# Patient Record
Sex: Female | Born: 1965 | Hispanic: Yes | Marital: Married | Smoking: Never smoker
Health system: Southern US, Community
[De-identification: ages and names within clinical notes are randomized; demographics above are authoritative.]

## PROBLEM LIST (undated history)

## (undated) DIAGNOSIS — I10 Essential (primary) hypertension: Secondary | ICD-10-CM

## (undated) HISTORY — PX: BREAST ENHANCEMENT SURGERY: SHX7

## (undated) HISTORY — PX: ABDOMINAL HYSTERECTOMY: SHX81

---

## 2009-11-06 HISTORY — PX: AUGMENTATION MAMMAPLASTY: SUR837

## 2018-10-14 ENCOUNTER — Emergency Department: Payer: Self-pay

## 2018-10-14 ENCOUNTER — Other Ambulatory Visit: Payer: Self-pay

## 2018-10-14 DIAGNOSIS — R0789 Other chest pain: Secondary | ICD-10-CM | POA: Insufficient documentation

## 2018-10-14 DIAGNOSIS — I10 Essential (primary) hypertension: Secondary | ICD-10-CM | POA: Insufficient documentation

## 2018-10-14 LAB — CBC
HCT: 38.6 % (ref 36.0–46.0)
Hemoglobin: 12.9 g/dL (ref 12.0–15.0)
MCH: 30.3 pg (ref 26.0–34.0)
MCHC: 33.4 g/dL (ref 30.0–36.0)
MCV: 90.6 fL (ref 80.0–100.0)
Platelets: 297 10*3/uL (ref 150–400)
RBC: 4.26 MIL/uL (ref 3.87–5.11)
RDW: 12.9 % (ref 11.5–15.5)
WBC: 6.7 10*3/uL (ref 4.0–10.5)
nRBC: 0 % (ref 0.0–0.2)

## 2018-10-14 LAB — BASIC METABOLIC PANEL
ANION GAP: 7 (ref 5–15)
BUN: 14 mg/dL (ref 6–20)
CO2: 24 mmol/L (ref 22–32)
Calcium: 9.1 mg/dL (ref 8.9–10.3)
Chloride: 107 mmol/L (ref 98–111)
Creatinine, Ser: 0.87 mg/dL (ref 0.44–1.00)
GFR calc Af Amer: >60 mL/min (ref >60)
GFR calc non Af Amer: >60 mL/min (ref >60)
Glucose, Bld: 100 mg/dL — ABNORMAL HIGH (ref 70–99)
POTASSIUM: 3.6 mmol/L (ref 3.5–5.1)
Sodium: 138 mmol/L (ref 135–145)

## 2018-10-14 LAB — TROPONIN I

## 2018-10-14 LAB — POCT PREGNANCY, URINE: Preg Test, Ur: NEGATIVE

## 2018-10-14 NOTE — ED Triage Notes (Signed)
Pt arrives to ED via POV from home with c/o left-sided CP with radiation into the left arm x2 hrs. (+) nausea, but denies vomiting. Pt denies any c/o SHOB. Pt with h/x of HTN but no other cardiac h/x.

## 2018-10-15 ENCOUNTER — Emergency Department
Admission: EM | Admit: 2018-10-15 | Discharge: 2018-10-15 | Disposition: A | Discharge status: Home / Self Care | Payer: Self-pay | Attending: Emergency Medicine | Admitting: Emergency Medicine

## 2018-10-15 DIAGNOSIS — R0789 Other chest pain: Secondary | ICD-10-CM

## 2018-10-15 HISTORY — DX: Essential (primary) hypertension: I10

## 2018-10-15 NOTE — ED Provider Notes (Signed)
[facility] Emergency Department Provider Note   ____________________________________________    I have reviewed the triage vital signs and the nursing notes.   HISTORY  Chief Complaint Chest Pain  Son is translating, no interpreter currently   HPI Tracey Raymond is a 52 y.o. female who presents with complaints of anterior chest discomfort.  Patient describes pain along the left side of her sternum.  It is worse with movement of her left arm.  She denies fevers or chills or diaphoresis.  No shortness of breath.  No cough or nausea or vomiting.  No history of heart disease.  She reports this pain started today and seems to have improved while in the waiting room.  No abdominal pain.  Has not taken anything for this.  Pain does not radiate   Past Medical History:  Diagnosis Date  . Hypertension     There are no active problems to display for this patient.   Past Surgical History:  Procedure Laterality Date  . ABDOMINAL HYSTERECTOMY    . BREAST ENHANCEMENT SURGERY    . CESAREAN SECTION      Prior to Admission medications   Not on File     Allergies Iodine and Metoclopramide  No family history on file.  Social History Social History   Tobacco Use  . Smoking status: Never Smoker  . Smokeless tobacco: Never Used  Substance Use Topics  . Alcohol use: Not on file  . Drug use: Not on file    Review of Systems  Constitutional: No fever/chills Eyes: No visual changes.  ENT: No neck pain Cardiovascular: As above Respiratory: Denies shortness of breath. Gastrointestinal: No abdominal pain.  No nausea, no vomiting.   Genitourinary: Negative for dysuria. Musculoskeletal: No calf pain Skin: Negative for rash. Neurological: Negative for headaches    ____________________________________________   PHYSICAL EXAM:  VITAL SIGNS: ED Triage Vitals  Enc Vitals Group     BP 10/14/18 2019 (!) 145/85     Pulse Rate 10/14/18 2019 75    Resp 10/14/18 2019 16     Temp 10/14/18 2019 98.4 F (36.9 C)     Temp Source 10/14/18 2019 Oral     SpO2 10/14/18 2019 100 %     Weight 10/14/18 2014 65.8 kg (145 lb)     Height 10/14/18 2014 1.499 m (4\' 11" )     Head Circumference --      Peak Flow --      Pain Score 10/14/18 2014 6     Pain Loc --      Pain Edu? --      Excl. in GC? --     Constitutional: Alert and oriented. No acute distress.  Eyes: Conjunctivae are normal.   Nose: No congestion/rhinnorhea. Mouth/Throat: Mucous membranes are moist.    Cardiovascular: Normal rate, regular rhythm. Grossly normal heart sounds.  Good peripheral circulation.  Respiratory: Normal respiratory effort.  No retractions. Lungs CTAB. Gastrointestinal: Soft and nontender. No distention.  No CVA tenderness. Genitourinary: deferred Musculoskeletal: No lower extremity tenderness nor edema.  Warm and well perfused Neurologic:  Normal speech and language. No gross focal neurologic deficits are appreciated.  Skin:  Skin is warm, dry and intact. No rash noted. Psychiatric: Mood and affect are normal. Speech and behavior are normal.  ____________________________________________   LABS (all labs ordered are listed, but only abnormal results are displayed)  Labs Reviewed  BASIC METABOLIC PANEL - Abnormal; Notable for the following components:  Result Value   Glucose, Bld 100 (*)    All other components within normal limits  CBC  TROPONIN I  POC URINE PREG, ED  POCT PREGNANCY, URINE    EKG  Rhythm: normal sinus rhythm QRS Axis: normal Intervals: normal ST/T Wave abnormalities: normal Narrative Interpretation: no evidence of acute ischemia  ____________________________________________  RADIOLOGY  Chest x-ray normal ____________________________________________   PROCEDURES  Procedure(s) performed: No  Procedures   Critical Care performed: No ____________________________________________   INITIAL IMPRESSION /  ASSESSMENT AND PLAN / ED COURSE  Pertinent labs & imaging results that were available during my care of the patient were reviewed by me and considered in my medical decision making (see chart for details).  Patient well-appearing in no acute distress.  She does have tenderness along the left border of her sternum which seems to mimic her pain.  EKG is entirely normal, lab work is greatly reassuring.  Chest x-ray normal.  Certainly seems to be consistent with chest wall pain.  Recommend anti-inflammatory/NSAIDs, rest and PCP follow-up.  Return precautions discussed if any worsening pain or shortness of breath    ____________________________________________   FINAL CLINICAL IMPRESSION(S) / ED DIAGNOSES  Final diagnoses:  Chest wall pain        Note:  This document was prepared using Dragon voice recognition software and may include unintentional dictation errors.    Jene Every, MD 10/15/18 [phone]

## 2018-10-15 NOTE — ED Notes (Signed)
ED Provider at bedside. 

## 2020-01-26 ENCOUNTER — Other Ambulatory Visit: Payer: Self-pay | Admitting: Physician Assistant

## 2020-01-26 DIAGNOSIS — R590 Localized enlarged lymph nodes: Secondary | ICD-10-CM

## 2020-02-09 ENCOUNTER — Ambulatory Visit: Payer: Self-pay | Attending: Internal Medicine

## 2020-02-09 DIAGNOSIS — Z23 Encounter for immunization: Secondary | ICD-10-CM

## 2020-02-09 NOTE — Progress Notes (Signed)
   Covid-19 Vaccination Clinic  Name:  Mackena Plummer    MRN: [id number] DOB: 1966/09/21  02/09/2020  Ms. Tomeika Weinmann was observed post Covid-19 immunization for 15 minutes without incident. She was provided with Vaccine Information Sheet and instruction to access the V-Safe system.   Ms. Judyth Demarais was instructed to call 911 with any severe reactions post vaccine: Marland Kitchen Difficulty breathing  . Swelling of face and throat  . A fast heartbeat  . A bad rash all over body  . Dizziness and weakness   Immunizations Administered    Name Date Dose VIS Date Route   Pfizer COVID-19 Vaccine 02/09/2020  3:39 PM 0.3 mL 10/17/2019 Intramuscular   Manufacturer: [employer], [employer]   Lot: [id number]   NDC: [address]-0

## 2020-02-16 ENCOUNTER — Ambulatory Visit
Admission: RE | Admit: 2020-02-16 | Discharge: 2020-02-16 | Disposition: A | Discharge status: Home / Self Care | Payer: 59 | Source: Ambulatory Visit | Attending: Physician Assistant | Admitting: Physician Assistant

## 2020-02-16 ENCOUNTER — Other Ambulatory Visit: Payer: Self-pay

## 2020-02-16 DIAGNOSIS — R590 Localized enlarged lymph nodes: Secondary | ICD-10-CM | POA: Insufficient documentation

## 2020-03-01 ENCOUNTER — Ambulatory Visit: Payer: 59 | Attending: Internal Medicine

## 2020-03-01 DIAGNOSIS — Z23 Encounter for immunization: Secondary | ICD-10-CM

## 2020-03-01 NOTE — Progress Notes (Signed)
   Covid-19 Vaccination Clinic  Name:  Tracey Raymond    MRN: [id number] DOB: 04/21/1966  03/01/2020  Ms. Tracey Raymond was observed post Covid-19 immunization for 15 minutes without incident. She was provided with Vaccine Information Sheet and instruction to access the V-Safe system.   Ms. Tracey Raymond was instructed to call 911 with any severe reactions post vaccine: Marland Kitchen Difficulty breathing  . Swelling of face and throat  . A fast heartbeat  . A bad rash all over body  . Dizziness and weakness   Immunizations Administered    Name Date Dose VIS Date Route   Pfizer COVID-19 Vaccine 03/01/2020  3:33 PM 0.3 mL 12/31/2018 Intramuscular   Manufacturer: [employer], [employer]   Lot: [id number]   NDC: [address]-8

## 2020-04-02 ENCOUNTER — Other Ambulatory Visit: Payer: Self-pay | Admitting: Physician Assistant

## 2020-04-02 DIAGNOSIS — Q899 Congenital malformation, unspecified: Secondary | ICD-10-CM

## 2020-05-17 ENCOUNTER — Other Ambulatory Visit: Payer: Self-pay | Admitting: Physician Assistant

## 2020-05-17 DIAGNOSIS — Z1231 Encounter for screening mammogram for malignant neoplasm of breast: Secondary | ICD-10-CM

## 2020-05-24 ENCOUNTER — Other Ambulatory Visit: Payer: Self-pay

## 2020-05-24 ENCOUNTER — Ambulatory Visit
Admission: RE | Admit: 2020-05-24 | Discharge: 2020-05-24 | Disposition: A | Discharge status: Home / Self Care | Payer: 59 | Source: Ambulatory Visit | Attending: Physician Assistant | Admitting: Physician Assistant

## 2020-05-24 DIAGNOSIS — Q899 Congenital malformation, unspecified: Secondary | ICD-10-CM

## 2020-05-31 ENCOUNTER — Other Ambulatory Visit: Payer: Self-pay

## 2020-05-31 ENCOUNTER — Ambulatory Visit
Admission: RE | Admit: 2020-05-31 | Discharge: 2020-05-31 | Disposition: A | Discharge status: Home / Self Care | Payer: 59 | Source: Ambulatory Visit | Attending: Physician Assistant | Admitting: Physician Assistant

## 2020-05-31 DIAGNOSIS — Q899 Congenital malformation, unspecified: Secondary | ICD-10-CM | POA: Insufficient documentation

## 2020-06-14 ENCOUNTER — Ambulatory Visit
Admission: RE | Admit: 2020-06-14 | Discharge: 2020-06-14 | Disposition: A | Discharge status: Home / Self Care | Payer: 59 | Source: Ambulatory Visit | Attending: Physician Assistant | Admitting: Physician Assistant

## 2020-06-14 ENCOUNTER — Other Ambulatory Visit: Payer: Self-pay

## 2020-06-14 ENCOUNTER — Other Ambulatory Visit: Payer: Self-pay | Admitting: Physician Assistant

## 2020-06-14 DIAGNOSIS — Z1231 Encounter for screening mammogram for malignant neoplasm of breast: Secondary | ICD-10-CM

## 2020-06-15 ENCOUNTER — Other Ambulatory Visit: Payer: Self-pay | Admitting: Physician Assistant

## 2020-06-15 DIAGNOSIS — R928 Other abnormal and inconclusive findings on diagnostic imaging of breast: Secondary | ICD-10-CM

## 2020-06-15 DIAGNOSIS — N6489 Other specified disorders of breast: Secondary | ICD-10-CM

## 2020-07-05 ENCOUNTER — Other Ambulatory Visit: Payer: Self-pay

## 2020-07-05 ENCOUNTER — Ambulatory Visit
Admission: RE | Admit: 2020-07-05 | Discharge: 2020-07-05 | Disposition: A | Discharge status: Home / Self Care | Payer: 59 | Source: Ambulatory Visit | Attending: Physician Assistant | Admitting: Physician Assistant

## 2020-07-05 DIAGNOSIS — R928 Other abnormal and inconclusive findings on diagnostic imaging of breast: Secondary | ICD-10-CM | POA: Insufficient documentation

## 2020-07-05 DIAGNOSIS — N6489 Other specified disorders of breast: Secondary | ICD-10-CM | POA: Diagnosis present

## 2021-02-01 ENCOUNTER — Other Ambulatory Visit: Payer: Self-pay | Admitting: Family Medicine

## 2021-02-01 DIAGNOSIS — M25561 Pain in right knee: Secondary | ICD-10-CM

## 2021-02-28 ENCOUNTER — Other Ambulatory Visit: Payer: Self-pay | Admitting: Physician Assistant

## 2021-02-28 DIAGNOSIS — G8929 Other chronic pain: Secondary | ICD-10-CM

## 2021-02-28 DIAGNOSIS — M25559 Pain in unspecified hip: Secondary | ICD-10-CM

## 2021-02-28 DIAGNOSIS — M25561 Pain in right knee: Secondary | ICD-10-CM

## 2021-03-15 ENCOUNTER — Ambulatory Visit
Admission: RE | Admit: 2021-03-15 | Discharge: 2021-03-15 | Disposition: A | Discharge status: Home / Self Care | Payer: 59 | Attending: Family Medicine | Admitting: Family Medicine

## 2021-03-15 ENCOUNTER — Other Ambulatory Visit: Payer: Self-pay | Admitting: Physician Assistant

## 2021-03-15 ENCOUNTER — Ambulatory Visit
Admission: RE | Admit: 2021-03-15 | Discharge: 2021-03-15 | Disposition: A | Discharge status: Home / Self Care | Payer: 59 | Source: Ambulatory Visit | Attending: Physician Assistant | Admitting: Physician Assistant

## 2021-03-15 DIAGNOSIS — M25559 Pain in unspecified hip: Secondary | ICD-10-CM

## 2021-03-15 DIAGNOSIS — G8929 Other chronic pain: Secondary | ICD-10-CM | POA: Insufficient documentation

## 2021-03-15 DIAGNOSIS — M25561 Pain in right knee: Secondary | ICD-10-CM

## 2021-04-29 IMAGING — MG DIGITAL SCREENING BREAST BILAT IMPLANT W/ TOMO W/ CAD
8 of 12 series · 8 of 28 positions shown · non-contrast
Comparison: None.

CLINICAL DATA: Screening.

EXAM:
DIGITAL SCREENING BILATERAL MAMMOGRAM WITH IMPLANTS, CAD AND TOMO
The patient has retropectoral silicone implants. Standard and
implant displaced views were performed.

[R MLO]
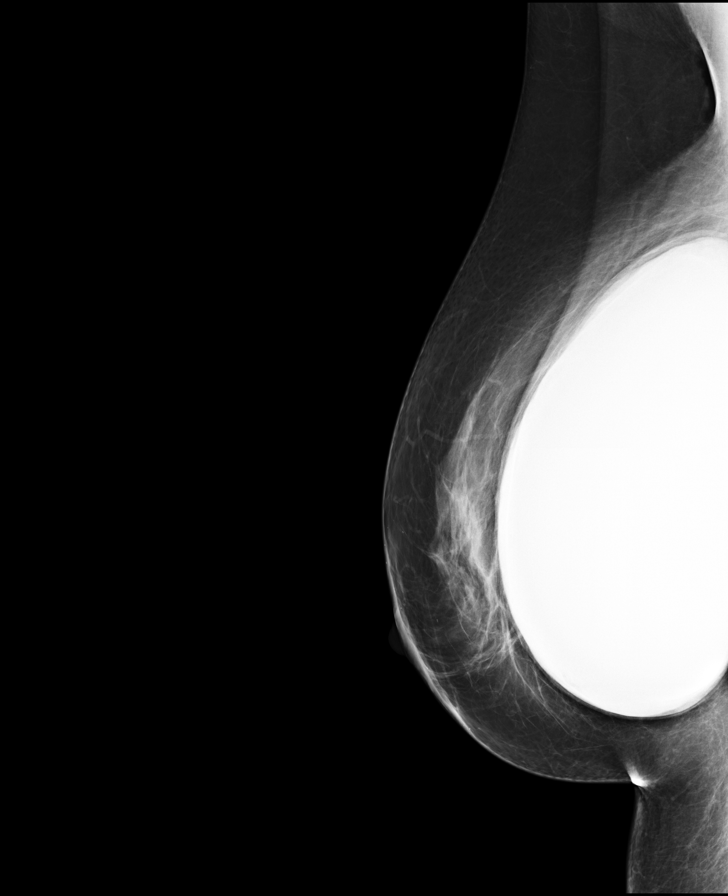

[L MLO]
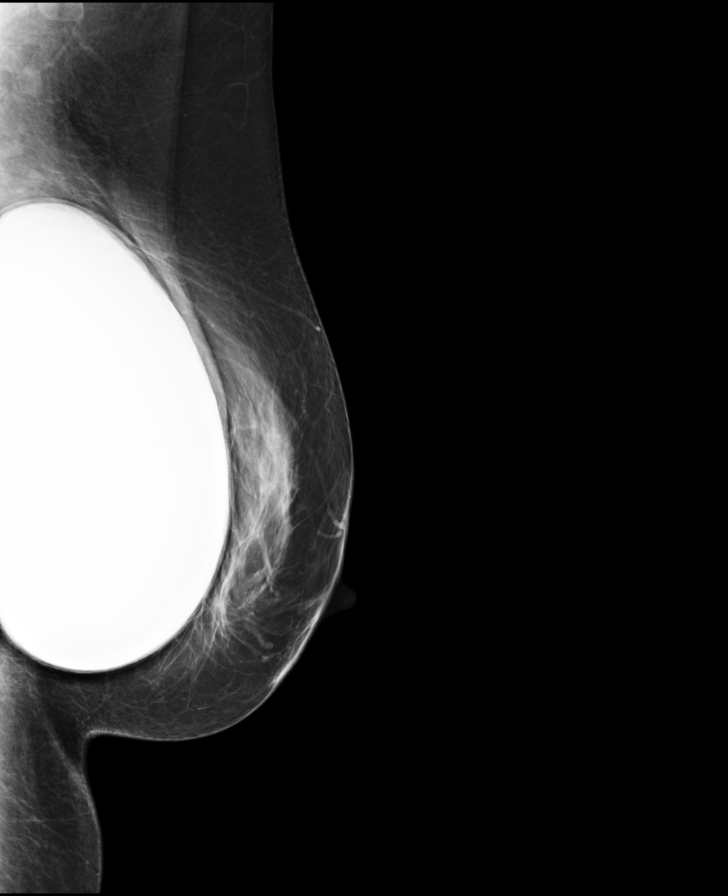

[L CC]
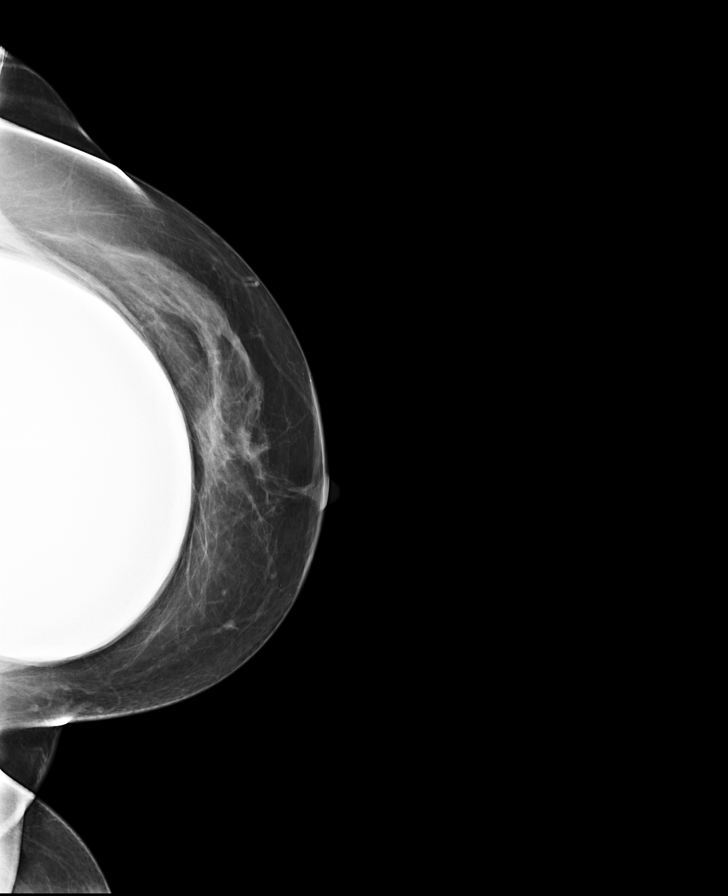

[R CC]
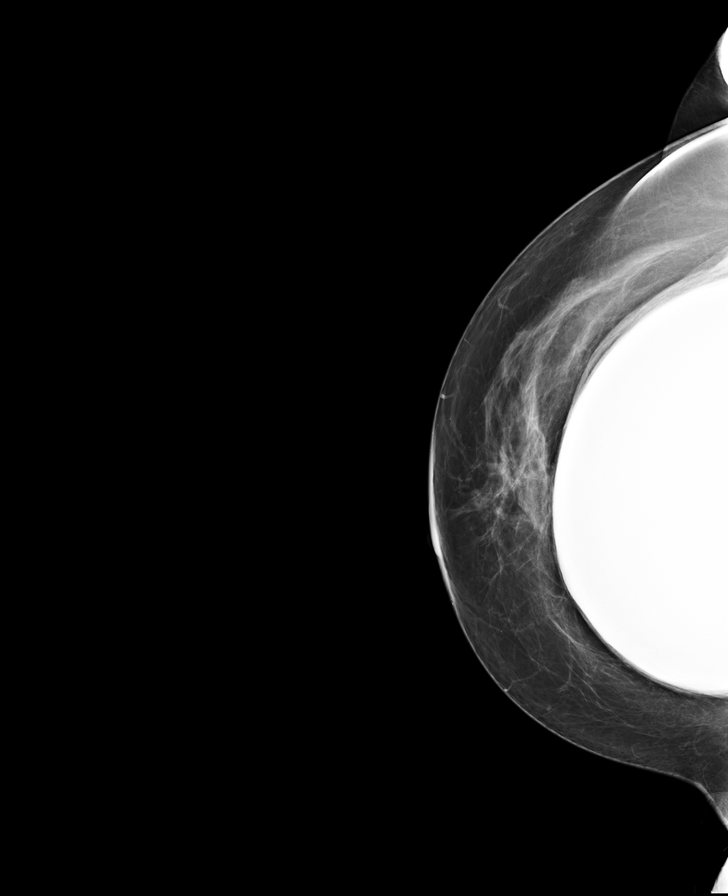

[R CC synth-2D]
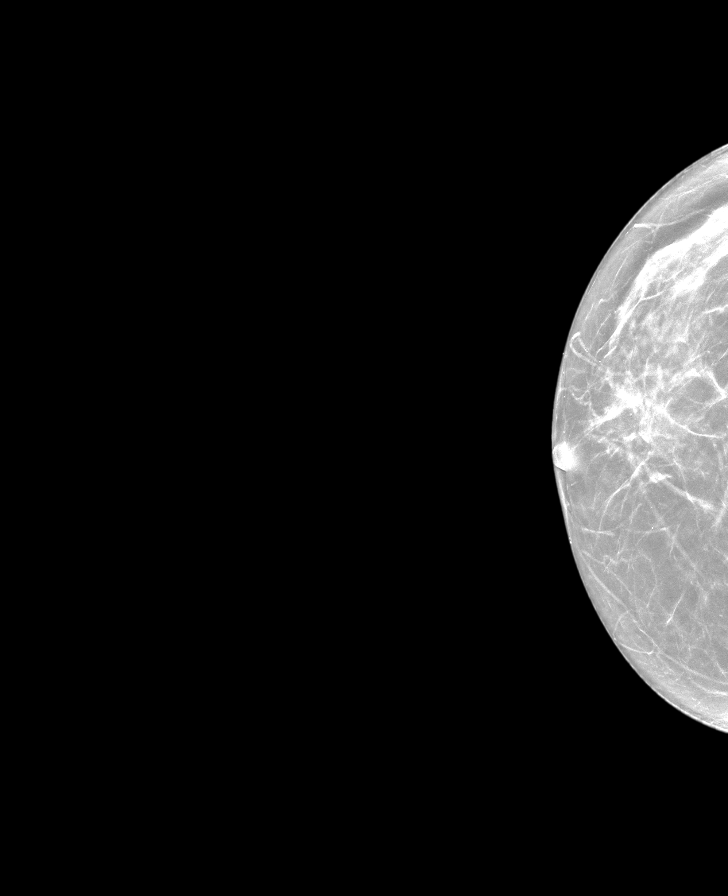

[R MLO synth-2D]
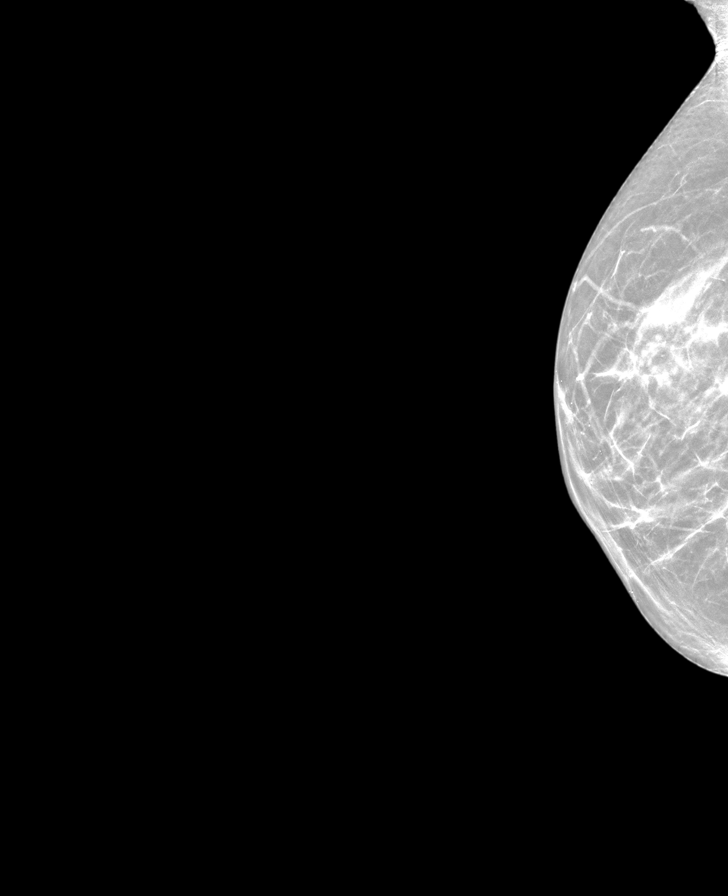

[L MLO synth-2D]
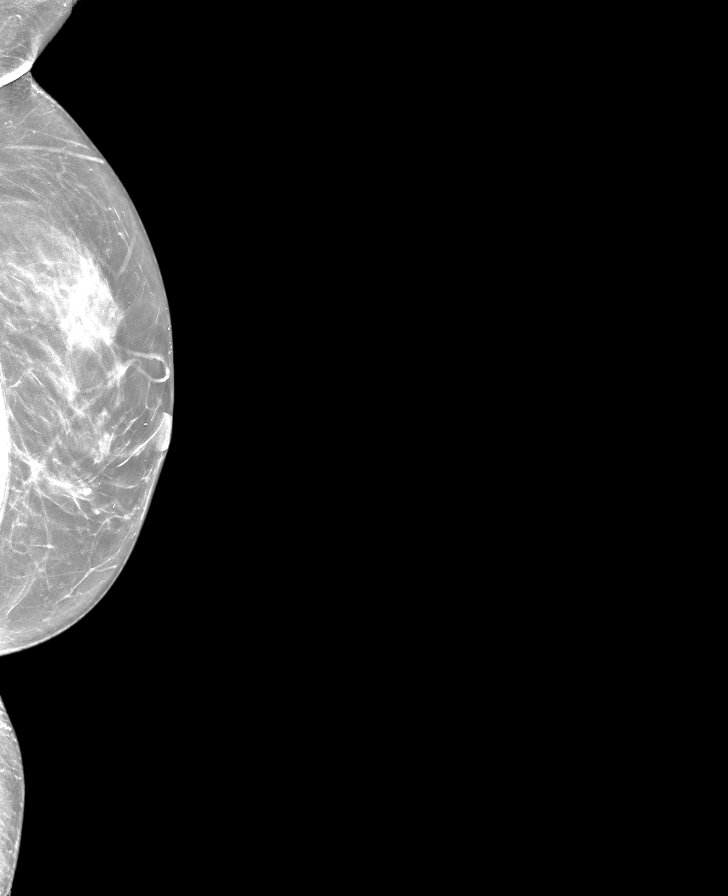

[L CC synth-2D]
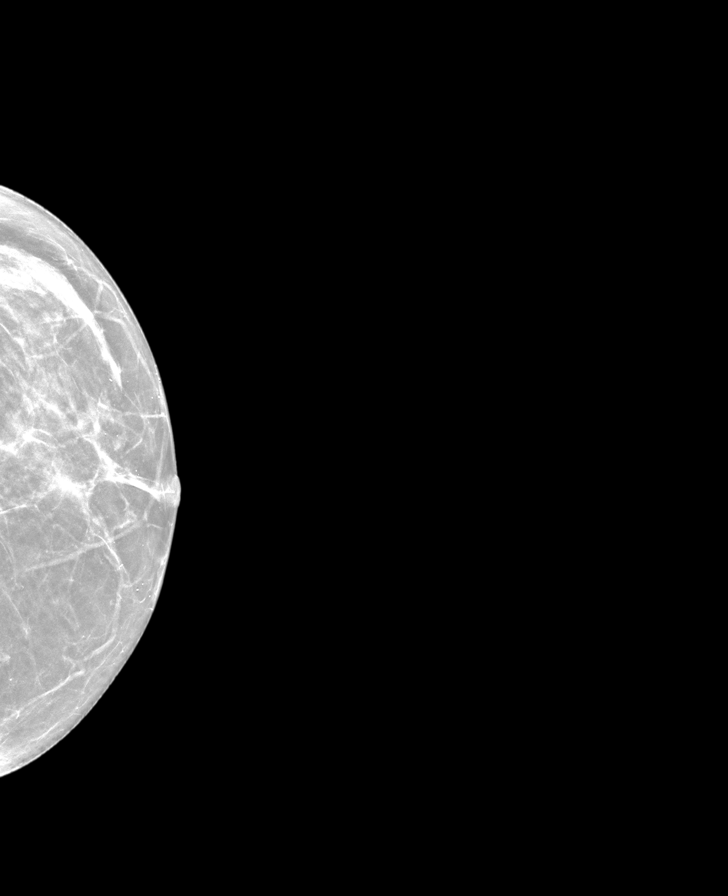

[8 of 28 positions shown; findings below may reference images not displayed]

ACR Breast Density Category b: There are scattered areas of
fibroglandular density.
FINDINGS: In the right breast, a possible focal asymmetry warrants further
evaluation. It is best seen on displaced MLO slice 31 in the
anterior depth upper breast. There is a possible correlate on CC
slice 31 in the outer breast. In the left breast, no suspicious
masses or malignant type calcifications are identified. Images were
processed with CAD.
IMPRESSION: Further evaluation is suggested for possible focal asymmetry in the
right breast.

RECOMMENDATION:
Diagnostic mammogram and possibly ultrasound of the right breast.
(Code:[id number])

The patient will be contacted regarding the findings, and additional
imaging will be scheduled.

BI-RADS CATEGORY  0: Incomplete. Need additional imaging evaluation
and/or prior mammograms for comparison.

## 2022-05-29 ENCOUNTER — Other Ambulatory Visit: Payer: Self-pay | Admitting: Physician Assistant

## 2022-05-29 DIAGNOSIS — R928 Other abnormal and inconclusive findings on diagnostic imaging of breast: Secondary | ICD-10-CM

## 2022-05-30 ENCOUNTER — Other Ambulatory Visit: Payer: Self-pay | Admitting: Physician Assistant

## 2022-05-30 DIAGNOSIS — R928 Other abnormal and inconclusive findings on diagnostic imaging of breast: Secondary | ICD-10-CM
